# Patient Record
Sex: Male | Born: 1955 | Race: White | Hispanic: No | Marital: Married | Smoking: Former smoker
Health system: Southern US, Community
[De-identification: ages and names within clinical notes are randomized; demographics above are authoritative.]

## PROBLEM LIST (undated history)

## (undated) DIAGNOSIS — R768 Other specified abnormal immunological findings in serum: Secondary | ICD-10-CM

## (undated) DIAGNOSIS — E785 Hyperlipidemia, unspecified: Secondary | ICD-10-CM

## (undated) DIAGNOSIS — Z8551 Personal history of malignant neoplasm of bladder: Secondary | ICD-10-CM

## (undated) DIAGNOSIS — K603 Anal fistula: Secondary | ICD-10-CM

## (undated) DIAGNOSIS — N200 Calculus of kidney: Secondary | ICD-10-CM

## (undated) DIAGNOSIS — I10 Essential (primary) hypertension: Secondary | ICD-10-CM

## (undated) DIAGNOSIS — K219 Gastro-esophageal reflux disease without esophagitis: Secondary | ICD-10-CM

## (undated) HISTORY — DX: Gastro-esophageal reflux disease without esophagitis: K21.9

## (undated) HISTORY — DX: Essential (primary) hypertension: I10

## (undated) HISTORY — DX: Anal fistula: K60.3

## (undated) HISTORY — DX: Personal history of malignant neoplasm of bladder: Z85.51

## (undated) HISTORY — DX: Hyperlipidemia, unspecified: E78.5

## (undated) HISTORY — DX: Calculus of kidney: N20.0

## (undated) HISTORY — DX: Other specified abnormal immunological findings in serum: R76.8

---

## 2011-09-09 DIAGNOSIS — C679 Malignant neoplasm of bladder, unspecified: Secondary | ICD-10-CM | POA: Insufficient documentation

## 2011-10-06 DIAGNOSIS — F17210 Nicotine dependence, cigarettes, uncomplicated: Secondary | ICD-10-CM | POA: Insufficient documentation

## 2014-11-08 LAB — HM COLONOSCOPY

## 2014-11-14 DIAGNOSIS — D126 Benign neoplasm of colon, unspecified: Secondary | ICD-10-CM | POA: Insufficient documentation

## 2015-08-15 DIAGNOSIS — K219 Gastro-esophageal reflux disease without esophagitis: Secondary | ICD-10-CM

## 2015-08-15 HISTORY — DX: Gastro-esophageal reflux disease without esophagitis: K21.9

## 2015-08-15 LAB — HM HEPATITIS C SCREENING LAB: HM HEPATITIS C SCREENING: POSITIVE

## 2015-08-25 DIAGNOSIS — M7552 Bursitis of left shoulder: Secondary | ICD-10-CM | POA: Insufficient documentation

## 2015-10-15 DIAGNOSIS — K603 Anal fistula, unspecified: Secondary | ICD-10-CM

## 2015-10-15 HISTORY — DX: Anal fistula, unspecified: K60.30

## 2015-10-15 HISTORY — DX: Anal fistula: K60.3

## 2015-11-03 DIAGNOSIS — R7689 Other specified abnormal immunological findings in serum: Secondary | ICD-10-CM

## 2015-11-03 DIAGNOSIS — R768 Other specified abnormal immunological findings in serum: Secondary | ICD-10-CM

## 2015-11-03 HISTORY — DX: Other specified abnormal immunological findings in serum: R76.89

## 2015-11-03 HISTORY — DX: Other specified abnormal immunological findings in serum: R76.8

## 2015-11-06 DIAGNOSIS — Z8551 Personal history of malignant neoplasm of bladder: Secondary | ICD-10-CM

## 2015-11-06 HISTORY — DX: Personal history of malignant neoplasm of bladder: Z85.51

## 2016-01-23 DIAGNOSIS — L732 Hidradenitis suppurativa: Secondary | ICD-10-CM | POA: Insufficient documentation

## 2016-05-03 DIAGNOSIS — M5090 Cervical disc disorder, unspecified, unspecified cervical region: Secondary | ICD-10-CM | POA: Insufficient documentation

## 2016-06-30 ENCOUNTER — Encounter: Payer: Self-pay | Admitting: Family Medicine

## 2016-06-30 ENCOUNTER — Ambulatory Visit (INDEPENDENT_AMBULATORY_CARE_PROVIDER_SITE_OTHER): Payer: 59 | Admitting: Family Medicine

## 2016-06-30 ENCOUNTER — Ambulatory Visit (INDEPENDENT_AMBULATORY_CARE_PROVIDER_SITE_OTHER): Payer: 59

## 2016-06-30 VITALS — BP 155/93 | HR 85

## 2016-06-30 DIAGNOSIS — M25562 Pain in left knee: Secondary | ICD-10-CM

## 2016-06-30 DIAGNOSIS — M25462 Effusion, left knee: Secondary | ICD-10-CM

## 2016-06-30 DIAGNOSIS — N200 Calculus of kidney: Secondary | ICD-10-CM

## 2016-06-30 DIAGNOSIS — E7800 Pure hypercholesterolemia, unspecified: Secondary | ICD-10-CM | POA: Insufficient documentation

## 2016-06-30 DIAGNOSIS — M2341 Loose body in knee, right knee: Secondary | ICD-10-CM | POA: Diagnosis not present

## 2016-06-30 DIAGNOSIS — I10 Essential (primary) hypertension: Secondary | ICD-10-CM

## 2016-06-30 HISTORY — DX: Essential (primary) hypertension: I10

## 2016-06-30 HISTORY — DX: Calculus of kidney: N20.0

## 2016-06-30 MED ORDER — DICLOFENAC SODIUM 1 % TD GEL: 4.0000 g | Freq: Four times a day (QID) | TRANSDERMAL | 11 refills | Status: AC

## 2016-06-30 NOTE — Progress Notes (Signed)
Brett Daniels is a 61 y.o. male who presents to [place] [facility] today for left knee pain. Patient developed acute onset of left knee pain a few days ago. He denies any specific injury but notes that he's been doing a lot of kneeling recently. He felt a pop when he stood from a kneeling position. His knee has had swelling and pain since. He notes the pain is predominantly at the medial aspect of the knee. No weakness or numbness or loss of function.   Past Medical History:  Diagnosis Date  . 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 Essential hypertension 06/30/2016  . Gastroesophageal reflux disease 08/15/2015  . 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 History of bladder cancer 11/06/2015   Overview:  S/p chemotherapy in the bladder  . Renal stones 06/30/2016   No past surgical history on file. Social History  Substance Use Topics  . Smoking status: Not on file  . Smokeless tobacco: Not on file  . Alcohol use Not on file   family history is not on file.  ROS:  No headache, visual changes, nausea, vomiting, diarrhea, constipation, dizziness, abdominal pain, skin rash, fevers, chills, night sweats, weight loss, swollen lymph nodes, body aches, joint swelling, muscle aches, chest pain, shortness of breath, mood changes, visual or auditory hallucinations.    Medications: Current Outpatient Prescriptions  Medication Sig Dispense Refill  . aspirin 81 MG EC tablet Take by mouth.    Marland Kitchen atorvastatin (LIPITOR) 20 MG tablet Take by mouth.    . celecoxib (CELEBREX) 200 MG capsule Take by mouth.    Marland Kitchen lisinopril-hydrochlorothiazide (PRINZIDE,ZESTORETIC) 20-12.5 MG tablet TAKE ONE TABLET BY MOUTH ONCE DAILY    . methylPREDNISolone (MEDROL DOSEPAK) 4 MG TBPK tablet Take by mouth.    . traMADol (ULTRAM) 50 MG tablet 1-2 tid prn pain    . diclofenac  sodium (VOLTAREN) 1 % GEL Apply 4 g topically 4 (four) times daily. To affected joint. 100 g 11   No current facility-administered medications for this visit.    Not on File   Exam:  BP (!) 155/93   Pulse 85  General: Well Developed, well nourished, and in no acute distress.  Neuro/Psych: Alert and oriented x3, extra-ocular muscles intact, able to move all 4 extremities, sensation grossly intact. Skin: Warm and dry, no rashes noted.  Respiratory: Not using accessory muscles, speaking in full sentences, trachea midline.  Cardiovascular: Pulses palpable, no extremity edema. Abdomen: Does not appear distended. MSK: Left knee moderate effusion otherwise normal-appearing. Range of motion normal without patellar crepitations. Tender to palpation medial joint line. Stable ligamentous exam. Intact extension and flexion strength.  Procedure: Real-time Ultrasound Guided Aspiration and Injection of left knee   Device: GE Logiq E  Images permanently stored and available for review in the ultrasound unit. Verbal informed consent obtained. Discussed risks and benefits of procedure. Warned about infection bleeding damage to structures skin hypopigmentation and fat atrophy among others. Patient expresses understanding and agreement Time-out conducted.  Noted no overlying erythema, induration, or other signs of local infection.  Skin prepped in a sterile fashion.  Local anesthesia: Topical Ethyl chloride.  Lidocaine without epinephrine injected achieving good anesthesia. With sterile technique and under real time ultrasound guidance: 18 gauge needle inserted into the knee joint and 22ml of strawberry translucent fluid removed. The syringe was exchanged  and 80mg  kenalog and 27ml marcaine injected easily.  Completed without difficulty  Pain immediately resolved suggesting accurate placement of the medication.  Advised to call if fevers/chills, erythema, induration, drainage, or persistent  bleeding.  Images permanently stored and available for review in the ultrasound unit.  Impression: Technically successful ultrasound guided injection.      No results found for this or any previous visit (from the past 48 hour(s)). No results found.    Assessment and Plan: 61 y.o. male with left knee pain and effusion.  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 Fluid culture and cell count differential pending. X-ray pending.  Treatment with injection as above as well as diclofenac gel. Return as needed.    Orders Placed This Encounter  Procedures  . Body Fluid Culture    Left knee  . DG Knee 1-2 Views Right    Standing Status:   Future    Number of Occurrences:   1    Standing Expiration Date:   08/31/2017    Order Specific Question:   Reason for Exam (SYMPTOM  OR DIAGNOSIS REQUIRED)    Answer:   For use with the left knee x-ray bilateral AP and Rosenberg standing.    Order Specific Question:   Preferred imaging location?    Answer:   [facility]  . DG Knee Complete 4 Views Left    Please include patellar sunrise, lateral, and weightbearing bilateral AP and bilateral rosenberg views    Standing Status:   Future    Number of Occurrences:   1    Standing Expiration Date:   08/30/2017    Order Specific Question:   Reason for exam:    Answer:   Please include patellar sunrise, lateral, and weightbearing bilateral AP and bilateral rosenberg views    Comments:   Please include patellar sunrise, lateral, and weightbearing bilateral AP and bilateral rosenberg views    Order Specific Question:   Preferred imaging location?    Answer:   [facility]  . Synovial cell count + diff, w/ crystals    Left knee    Discussed warning signs or symptoms. Please see discharge instructions. Patient expresses understanding.

## 2016-06-30 NOTE — Patient Instructions (Signed)
Thank you for coming in today. Get xray today.  Apply voltaren gel for pain 4x daily as needed.  Return as needed if not better.    Meniscus Tear A meniscus tear is a knee injury in which a piece of the meniscus is torn. The meniscus is a thick, rubbery, wedge-shaped cartilage in the knee. Two menisci are located in each knee. They sit between the upper bone (femur) and lower bone (tibia) that make up the knee joint. Each meniscus acts as a shock absorber for the knee. A torn meniscus is one of the most common types of knee injuries. This injury can range from mild to severe. Surgery may be needed for a severe tear. What are the causes? This injury may be caused by any squatting, twisting, or pivoting movement. Sports-related injuries are the most common cause. These often occur from:  Running and stopping suddenly.  Changing direction.  Being tackled or knocked off your feet. As people get older, their meniscus gets thinner and weaker. In these people, tears can happen more easily, such as from climbing stairs. What increases the risk? This injury is more likely to happen to:  People who play contact sports.  Males.  People who are 59?59 years of age. What are the signs or symptoms? Symptoms of this injury include:  Knee pain, especially at the side of the knee joint. You may feel pain when the injury occurs, or you may only hear a pop and feel pain later.  A feeling that your knee is clicking, catching, locking, or giving way.  Not being able to fully bend or extend your knee.  Bruising or swelling in your knee. How is this diagnosed? This injury may be diagnosed based on your symptoms and a physical exam. The physical exam may include:  Moving your knee in different ways.  Feeling for tenderness.  Listening for a clicking sound.  Checking if your knee locks or catches. You may also have tests, such as:  X-rays.  MRI.  A procedure to look inside your knee with a  narrow surgical telescope (arthroscopy). You may be referred to a knee specialist (orthopedic surgeon). How is this treated? Treatment for this injury depends on the severity of the tear. Treatment for a mild tear may include:  Rest.  Medicine to reduce pain and swelling. This is usually a nonsteroidal anti-inflammatory drug (NSAID).  A knee brace or an elastic sleeve or wrap.  Using crutches or a walker to keep weight off your knee and to help you walk.  Exercises to strengthen your knee (physical therapy). You may need surgery if you have a severe tear or if other treatments are not working. Follow these instructions at home: Managing pain and swelling   Take over-the-counter and prescription medicines only as told by your health care provider.  If directed, apply ice to the injured area:  Put ice in a plastic bag.  Place a towel between your skin and the bag.  Leave the ice on for 20 minutes, 2-3 times per day.  Raise (elevate) the injured area above the level of your heart while you are sitting or lying down. Activity   Do not use the injured limb to support your body weight until your health care provider says that you can. Use crutches or a walker as told by your health care provider.  Return to your normal activities as told by your health care provider. Ask your health care provider what activities are safe for  you.  Perform range-of-motion exercises only as told by your health care provider.  Begin doing exercises to strengthen your knee and leg muscles only as told by your health care provider. After you recover, your health care provider may recommend these exercises to help prevent another injury. General instructions   Use a knee brace or elastic wrap as told by your health care provider.  Keep all follow-up visits as told by your health care provider. This is important. Contact a health care provider if:  You have a fever.  Your knee becomes red, tender,  or swollen.  Your pain medicine is not helping.  Your symptoms get worse or do not improve after 2 weeks of home care. This information is not intended to replace advice given to you by your health care provider. Make sure you discuss any questions you have with your health care provider. Document Released: 06/06/2002 Document Revised: 08/22/2015 Document Reviewed: 07/09/2014 Elsevier Interactive Patient Education  2017 [employer].

## 2016-07-01 LAB — SYNOVIAL CELL COUNT + DIFF, W/ CRYSTALS
Basophils, %: 0 %
EOSINOPHILS-SYNOVIAL: 0 % (ref 0–2)
Lymphocytes-Synovial Fld: 24 % (ref 0–74)
MONOCYTE/MACROPHAGE: 10 % (ref 0–69)
NEUTROPHIL, SYNOVIAL: 59 % — AB (ref 0–24)
Synoviocytes, %: 7 % (ref 0–15)
WBC, SYNOVIAL: 630 {cells}/uL — AB (ref <150)

## 2016-07-05 LAB — BODY FLUID CULTURE
GRAM STAIN: NONE SEEN
Gram Stain: NONE SEEN
Organism ID, Bacteria: NO GROWTH

## 2016-07-30 ENCOUNTER — Ambulatory Visit: Payer: 59 | Admitting: Family Medicine

## 2016-08-20 ENCOUNTER — Encounter (INDEPENDENT_AMBULATORY_CARE_PROVIDER_SITE_OTHER): Payer: Self-pay

## 2016-08-20 ENCOUNTER — Ambulatory Visit (INDEPENDENT_AMBULATORY_CARE_PROVIDER_SITE_OTHER): Payer: 59 | Admitting: Family Medicine

## 2016-08-20 ENCOUNTER — Encounter: Payer: Self-pay | Admitting: Family Medicine

## 2016-08-20 VITALS — BP 117/76 | HR 79 | Temp 97.9°F | Wt 180.0 lb

## 2016-08-20 DIAGNOSIS — I10 Essential (primary) hypertension: Secondary | ICD-10-CM

## 2016-08-20 DIAGNOSIS — Z8551 Personal history of malignant neoplasm of bladder: Secondary | ICD-10-CM

## 2016-08-20 DIAGNOSIS — K219 Gastro-esophageal reflux disease without esophagitis: Secondary | ICD-10-CM

## 2016-08-20 DIAGNOSIS — H60332 Swimmer's ear, left ear: Secondary | ICD-10-CM

## 2016-08-20 DIAGNOSIS — H6122 Impacted cerumen, left ear: Secondary | ICD-10-CM | POA: Diagnosis not present

## 2016-08-20 DIAGNOSIS — E7800 Pure hypercholesterolemia, unspecified: Secondary | ICD-10-CM

## 2016-08-20 MED ORDER — ZOSTER VAC RECOMB ADJUVANTED 50 MCG/0.5ML IM SUSR: 0.5000 mL | Freq: Once | INTRAMUSCULAR | 1 refills | Status: AC

## 2016-08-20 MED ORDER — ATORVASTATIN CALCIUM 20 MG PO TABS: 20.0000 mg | ORAL_TABLET | Freq: Every day | ORAL | 3 refills | Status: DC

## 2016-08-20 MED ORDER — LISINOPRIL-HYDROCHLOROTHIAZIDE 20-12.5 MG PO TABS: 1.0000 | ORAL_TABLET | Freq: Every day | ORAL | 3 refills | Status: DC

## 2016-08-20 MED ORDER — OMEPRAZOLE 20 MG PO CPDR: 20.0000 mg | DELAYED_RELEASE_CAPSULE | Freq: Every day | ORAL | 3 refills | Status: AC

## 2016-08-20 MED ORDER — NEOMYCIN-POLYMYXIN-HC 3.5-10000-1 OT SOLN: 4.0000 [drp] | Freq: Four times a day (QID) | OTIC | 1 refills | Status: DC

## 2016-08-20 NOTE — Progress Notes (Signed)
Brett Daniels is a 61 y.o. male who presents to [facility] [place] [place] today for establish medical care and discuss left-sided ear pain. Patient notes a several week history of left ear pain. He was seen in an urgent care and prescribed oral amoxicillin and some unknown eardrops. This has helped a little. He denies any significant change of hearing but does note some crackling sounds in his ear. He notes the ear is very tender to touch and with chewing. He denies any fevers or chills.  His past medical history is significant for hypertension hyperlipidemia and a history of bladder cancer. Additionally he has a history of past infection with hepatitis C that is not currently active. He feels well. He recently quit smoking earlier this month and is very optimistic about his health.    Past Medical History:  Diagnosis Date  . 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 Essential hypertension 06/30/2016  . Gastroesophageal reflux disease 08/15/2015  . Hepatitis C antibody positive in blood 11/03/2015   Overview:  Confirmed negative by Dr Shary Key at [facility]  . History of bladder cancer 11/06/2015   Overview:  S/p chemotherapy in the bladder  . Hyperlipidemia   . Renal stones 06/30/2016   History reviewed. No pertinent surgical history. Social History  Substance Use Topics  . Smoking status: Former Smoker    Packs/day: 1.00    Years: 40.00    Types: Cigarettes    Quit date: 08/03/2016  . Smokeless tobacco: Never Used  . Alcohol use Yes   family history includes Cancer in his brother; Diabetes in his mother and sister; Heart disease in his brother; Hyperlipidemia in his brother and sister; Hypertension in his brother and sister.  ROS as above:  Medications: Current Outpatient Prescriptions  Medication Sig Dispense Refill  . AMOXICILLIN PO Take by  mouth.    Marland Kitchen aspirin 81 MG EC tablet Take by mouth.    Marland Kitchen atorvastatin (LIPITOR) 20 MG tablet Take 1 tablet (20 mg total) by mouth daily. 90 tablet 3  . diclofenac sodium (VOLTAREN) 1 % GEL Apply 4 g topically 4 (four) times daily. To affected joint. 100 g 11  . lisinopril-hydrochlorothiazide (PRINZIDE,ZESTORETIC) 20-12.5 MG tablet Take 1 tablet by mouth daily. 90 tablet 3  . co-enzyme Q-10 30 MG capsule Take by mouth.    . neomycin-polymyxin-hydrocortisone (CORTISPORIN) otic solution Place 4 drops into the left ear 4 (four) times daily. 10 mL 1  . omeprazole (PRILOSEC) 20 MG capsule Take 1 capsule (20 mg total) by mouth daily. 90 capsule 3  . TURMERIC PO Take by mouth.    . Vitamin D, Cholecalciferol, 1000 units CAPS Take 1,000 units of lipase by mouth.    . Zoster Vac Recomb Adjuvanted [facility]) injection Inject 0.5 mLs into the muscle once. Give again after 2 months. If administered in clinic please fax report to Dr Georgina Snell [phone] 1 each 1   No current facility-administered medications for this visit.    Not on File  Health Maintenance Health Maintenance  Topic Date Due  . Hepatitis C Screening  21-May-1955  . HIV Screening  11/20/1972  . COLONOSCOPY  11/21/2007  . INFLUENZA VACCINE  10/28/2016  . TETANUS/TDAP  10/21/2024     Exam:  BP 117/76   Pulse 79   Temp 97.9 F (36.6 C) (Oral)   Wt 180 lb (81.6 kg)  Gen: Well NAD HEENT:  EOMI,  MMM right TM milky no bulging. Nontender. Left ear is normal-appearing externally. Tender with motion. Ear canal obstructed by cerumen.  Lungs: Normal work of breathing. CTABL Heart: RRR no MRG Abd: NABS, Soft. Nondistended, Nontender Exts: Brisk capillary refill, warm and well perfused.   Irrigated from the left ear. Following cerumen removal the ear canal was erythematous and narrow.    No results found for this or any previous visit (from the past 72 hour(s)). No results found.    Assessment and Plan: 61 y.o. male with    Left-sided cerumen impaction with otitis externa. Treat empirically with Cortisporin eardrops. Recommend also rubbing alcohol intermittently. Plan for watchful waiting.  Hypertension doing well continue current regimen.  Hyperlipidemia doing well continue current regimen will check fasting lipids at a wellness exam in August or early September.  History of bladder cancer doing well on annual surveillance with urology.  Smoking: Patient quit smoking a few weeks ago. He is doing great continue to follow.   No orders of the defined types were placed in this encounter.  Meds ordered this encounter  Medications  . AMOXICILLIN PO    Sig: Take by mouth.  . Vitamin D, Cholecalciferol, 1000 units CAPS    Sig: Take 1,000 units of lipase by mouth.  . co-enzyme Q-10 30 MG capsule    Sig: Take by mouth.  . DISCONTD: omeprazole (PRILOSEC) 20 MG capsule    Sig: Take by mouth.  . TURMERIC PO    Sig: Take by mouth.  Marland Kitchen atorvastatin (LIPITOR) 20 MG tablet    Sig: Take 1 tablet (20 mg total) by mouth daily.    Dispense:  90 tablet    Refill:  3  . lisinopril-hydrochlorothiazide (PRINZIDE,ZESTORETIC) 20-12.5 MG tablet    Sig: Take 1 tablet by mouth daily.    Dispense:  90 tablet    Refill:  3  . omeprazole (PRILOSEC) 20 MG capsule    Sig: Take 1 capsule (20 mg total) by mouth daily.    Dispense:  90 capsule    Refill:  3  . neomycin-polymyxin-hydrocortisone (CORTISPORIN) otic solution    Sig: Place 4 drops into the left ear 4 (four) times daily.    Dispense:  10 mL    Refill:  1  . Zoster Vac Recomb Adjuvanted [facility]) injection    Sig: Inject 0.5 mLs into the muscle once. Give again after 2 months. If administered in clinic please fax report to Dr Georgina Snell [phone]    Dispense:  1 each    Refill:  1     Discussed warning signs or symptoms. Please see discharge instructions. Patient expresses understanding.

## 2016-08-20 NOTE — Patient Instructions (Signed)
Thank you for coming in today. Use the cortisporin drops 4x daily.  Use also over the counter rubbing alcohol in between drops.  Recheck as needed.    Otitis Externa Otitis externa is an infection of the outer ear canal. The outer ear canal is the area between the outside of the ear and the eardrum. Otitis externa is sometimes called "swimmer's ear." What are the causes? This condition may be caused by:  Swimming in dirty water.  Moisture in the ear.  An injury to the inside of the ear.  An object stuck in the ear.  A cut or scrape on the outside of the ear. What increases the risk? This condition is more likely to develop in swimmers. What are the signs or symptoms? The first symptom of this condition is often itching in the ear. Later signs and symptoms include:  Swelling of the ear.  Redness in the ear.  Ear pain. The pain may get worse when you pull on your ear.  Pus coming from the ear. How is this diagnosed? This condition may be diagnosed by examining the ear and testing fluid from the ear for bacteria and funguses. How is this treated? This condition may be treated with:  Antibiotic ear drops. These are often given for 10-14 days.  Medicine to reduce itching and swelling. Follow these instructions at home:  If you were prescribed antibiotic ear drops, apply them as told by your health care provider. Do not stop using the antibiotic even if your condition improves.  Take over-the-counter and prescription medicines only as told by your health care provider.  Keep all follow-up visits as told by your health care provider. This is important. How is this prevented?  Keep your ear dry. Use the corner of a towel to dry your ear after you swim or bathe.  Avoid scratching or putting things in your ear. Doing these things can damage the ear canal or remove the protective wax that lines it, which makes it easier for bacteria and funguses to grow.  Avoid swimming in  lakes, polluted water, or pools that may not have the right amount of chlorine.  Consider making ear drops and putting 3 or 4 drops in each ear after you swim. Ask your health care provider about how you can make ear drops. Contact a health care provider if:  You have a fever.  After 3 days your ear is still red, swollen, painful, or draining pus.  Your redness, swelling, or pain gets worse.  You have a severe headache.  You have redness, swelling, pain, or tenderness in the area behind your ear. This information is not intended to replace advice given to you by your health care provider. Make sure you discuss any questions you have with your health care provider. Document Released: 03/16/2005 Document Revised: 04/23/2015 Document Reviewed: 12/24/2014 Elsevier Interactive Patient Education  2017 [employer].

## 2016-10-30 ENCOUNTER — Telehealth: Payer: Self-pay | Admitting: Family Medicine

## 2016-10-30 NOTE — Telephone Encounter (Signed)
If he's having a recurrence of swimmer's ear as well as cerumen impaction which he was seen for previously, this is easily treated in the primary care setting, if it something else, we would probably need to see it to determine if ENT is the appropriate referral.

## 2016-10-30 NOTE — Telephone Encounter (Signed)
Pt would like referral to ENT for his ears. Thanks

## 2016-10-30 NOTE — Telephone Encounter (Signed)
Patient did see Dr Georgina Snell in May for ear problems.

## 2016-11-06 ENCOUNTER — Telehealth: Payer: Self-pay | Admitting: Family Medicine

## 2016-11-06 MED ORDER — NEOMYCIN-POLYMYXIN-HC 3.5-10000-1 OT SOLN: 4.0000 [drp] | Freq: Four times a day (QID) | OTIC | 1 refills | Status: DC

## 2016-11-06 NOTE — Telephone Encounter (Signed)
Recurrent ear pain suspect otitis externa.  Tx Cortisporin.  Follow up with me soon.

## 2016-11-09 NOTE — Telephone Encounter (Signed)
Patient will follow up for recurrent ear infections.

## 2016-11-09 NOTE — Telephone Encounter (Signed)
Left message for a return call. See other telephone note.

## 2016-11-09 NOTE — Telephone Encounter (Signed)
Left message for a return call

## 2016-11-16 ENCOUNTER — Encounter: Payer: Self-pay | Admitting: Family Medicine

## 2016-11-16 ENCOUNTER — Ambulatory Visit (INDEPENDENT_AMBULATORY_CARE_PROVIDER_SITE_OTHER): Payer: 59 | Admitting: Family Medicine

## 2016-11-16 VITALS — BP 135/87 | HR 72 | Temp 98.0°F | Wt 194.0 lb

## 2016-11-16 DIAGNOSIS — H60392 Other infective otitis externa, left ear: Secondary | ICD-10-CM | POA: Diagnosis not present

## 2016-11-16 DIAGNOSIS — S161XXA Strain of muscle, fascia and tendon at neck level, initial encounter: Secondary | ICD-10-CM

## 2016-11-16 MED ORDER — CYCLOBENZAPRINE HCL 10 MG PO TABS: 10.0000 mg | ORAL_TABLET | Freq: Every day | ORAL | 1 refills | Status: AC

## 2016-11-16 MED ORDER — CLOTRIMAZOLE 1 % EX SOLN: CUTANEOUS | 1 refills | Status: DC

## 2016-11-16 NOTE — Patient Instructions (Signed)
Thank you for coming in today. Apply the clotrimazole solution to the ear twice daily for 14 days.  You should hear form ENT.   Take flexeril at bedtime as needed for pain.   Do home exercises.   Recheck as needed.   Let me know if you want to DO PT.    TENS UNIT: This is helpful for muscle pain and spasm.   Search and Purchase a TENS 7000 2nd edition at  www.tenspros.com or www.[employer].com It should be less than $30.     TENS unit instructions: Do not shower or bathe with the unit on Turn the unit off before removing electrodes or batteries If the electrodes lose stickiness add a drop of water to the electrodes after they are disconnected from the unit and place on plastic sheet. If you continued to have difficulty, call the TENS unit company to purchase more electrodes. Do not apply lotion on the skin area prior to use. Make sure the skin is clean and dry as this will help prolong the life of the electrodes. After use, always check skin for unusual red areas, rash or other skin difficulties. If there are any skin problems, does not apply electrodes to the same area. Never remove the electrodes from the unit by pulling the wires. Do not use the TENS unit or electrodes other than as directed. Do not change electrode placement without consultating your therapist or physician. Keep 2 fingers with between each electrode. Wear time ratio is 2:1, on to off times.    For example on for 30 minutes off for 15 minutes and then on for 30 minutes off for 15 minutes

## 2016-11-16 NOTE — Progress Notes (Signed)
Brett Daniels is a 61 y.o. male who presents to [facility]: [facility] today for left ear pain. Patient has a history of recurrent otitis externa. This is often complicated by cerumen impaction. He notes the last month he's had bilateral left worse than right ear pain. He's used prescription Cortisporin drops which have helped only a little. Additionally he's tried over-the-counter rubbing alcohol drops which have not helped much. He denies any fevers or chills.  Additionally patient notes neck pain. He notes a month long history of pain in the posterior aspect of the neck that is interfering with sleep. He denies any radiating pain weakness or numbness. He denies any injury. He's tried some over-the-counter medications which have helped some.   Past Medical History:  Diagnosis Date  . 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 Essential hypertension 06/30/2016  . Gastroesophageal reflux disease 08/15/2015  . Hepatitis C antibody positive in blood 11/03/2015   Overview:  Confirmed negative by Dr Shary Key at [facility]  . History of bladder cancer 11/06/2015   Overview:  S/p chemotherapy in the bladder  . Hyperlipidemia   . Renal stones 06/30/2016   No past surgical history on file. Social History  Substance Use Topics  . Smoking status: Former Smoker    Packs/day: 1.00    Years: 40.00    Types: Cigarettes    Quit date: 08/03/2016  . Smokeless tobacco: Never Used  . Alcohol use Yes   family history includes Cancer in his brother; Diabetes in his mother and sister; Heart disease in his brother; Hyperlipidemia in his brother and sister; Hypertension in his brother and sister.  ROS as above:  Medications: Current Outpatient Prescriptions  Medication Sig Dispense Refill  . AMOXICILLIN PO Take by mouth.    Marland Kitchen aspirin 81 MG EC tablet Take by mouth.     Marland Kitchen atorvastatin (LIPITOR) 20 MG tablet Take 1 tablet (20 mg total) by mouth daily. 90 tablet 3  . co-enzyme Q-10 30 MG capsule Take by mouth.    . diclofenac sodium (VOLTAREN) 1 % GEL Apply 4 g topically 4 (four) times daily. To affected joint. 100 g 11  . lisinopril-hydrochlorothiazide (PRINZIDE,ZESTORETIC) 20-12.5 MG tablet Take 1 tablet by mouth daily. 90 tablet 3  . neomycin-polymyxin-hydrocortisone (CORTISPORIN) OTIC solution Place 4 drops into the left ear 4 (four) times daily. 10 mL 1  . omeprazole (PRILOSEC) 20 MG capsule Take 1 capsule (20 mg total) by mouth daily. 90 capsule 3  . TURMERIC PO Take by mouth.    . Vitamin D, Cholecalciferol, 1000 units CAPS Take 1,000 units of lipase by mouth.    . clotrimazole (LOTRIMIN) 1 % external solution Apply 4 drops to left ear BID x14 days. OK to sub to OTIC form 30 mL 1  . cyclobenzaprine (FLEXERIL) 10 MG tablet Take 1 tablet (10 mg total) by mouth at bedtime. 90 tablet 1   No current facility-administered medications for this visit.    Not on File  Health Maintenance Health Maintenance  Topic Date Due  . Hepatitis C Screening  1955/12/10  . HIV Screening  11/20/1970  . COLONOSCOPY  11/21/2007  . INFLUENZA VACCINE  10/28/2016  . TETANUS/TDAP  10/21/2024     Exam:  BP 135/87   Pulse 72   Temp 98 F (36.7 C) (Oral)   Wt 194 lb (88 kg)   SpO2 97%  Gen: Well  NAD  HEENT: EOMI,  MMM Left ear canal is narrowed inflamed with mild erythema with whitish material. Small amount of discharge is present.  The right ear is similar but less affected. Mastoids are nontender bilaterally. Lungs: Normal work of breathing. CTABL Heart: RRR no MRG Abd: NABS, Soft. Nondistended, Nontender Exts: Brisk capillary refill, warm and well perfused.  MSK: C-spine is nontender to midline. Tender palpation bilateral cervical paraspinal muscle group. Normal neck motion.  No results found for this or any previous visit (from the past 72 hour(s)). No results  found.    Assessment and Plan: 61 y.o. male with  Otitis externa unclear etiology. Bacterial and follow culture pending of left ear. Additionally refer to ENT. Will use clotrimazole solution for presumed fungal otitis externa.  Neck pain: Likely myofascial disruption. Plan for Flexeril at bedtime as well as self exercise type physical therapy. If not better will refer for formal physical therapy.   Orders Placed This Encounter  Procedures  . Fungal Stain    ear  . Ear Culture    Left ear  . Ambulatory referral to ENT    Referral Priority:   Routine    Referral Type:   Consultation    Referral Reason:   Specialty Services Required    Requested Specialty:   Otolaryngology    Number of Visits Requested:   1   Meds ordered this encounter  Medications  . cyclobenzaprine (FLEXERIL) 10 MG tablet    Sig: Take 1 tablet (10 mg total) by mouth at bedtime.    Dispense:  90 tablet    Refill:  1  . clotrimazole (LOTRIMIN) 1 % external solution    Sig: Apply 4 drops to left ear BID x14 days. OK to sub to OTIC form    Dispense:  30 mL    Refill:  1     Discussed warning signs or symptoms. Please see discharge instructions. Patient expresses understanding.

## 2016-11-18 LAB — FUNGAL STAIN

## 2016-11-19 ENCOUNTER — Telehealth: Payer: Self-pay

## 2016-11-19 MED ORDER — MICONAZOLE NITRATE 2 % EX SOLN: CUTANEOUS | 12 refills | Status: AC

## 2016-11-19 NOTE — Telephone Encounter (Signed)
Brett Daniels from [place] called and stated that the Lotrimin 1% solution is not covered by his insurance, and is expensive.  Is there another option?

## 2016-11-19 NOTE — Telephone Encounter (Signed)
Clotrimazole is unavailable. We'll send in miconazole prescription to compounding pharmacy. Patient notified

## 2016-11-20 LAB — EAR CULTURE

## 2017-02-25 ENCOUNTER — Encounter: Payer: 59 | Admitting: Family Medicine

## 2017-03-04 ENCOUNTER — Ambulatory Visit (INDEPENDENT_AMBULATORY_CARE_PROVIDER_SITE_OTHER): Payer: 59 | Admitting: Family Medicine

## 2017-03-04 ENCOUNTER — Encounter: Payer: Self-pay | Admitting: Family Medicine

## 2017-03-04 VITALS — BP 143/97 | HR 65 | Ht 71.0 in | Wt 199.0 lb

## 2017-03-04 DIAGNOSIS — E7800 Pure hypercholesterolemia, unspecified: Secondary | ICD-10-CM

## 2017-03-04 DIAGNOSIS — Z0000 Encounter for general adult medical examination without abnormal findings: Secondary | ICD-10-CM | POA: Diagnosis not present

## 2017-03-04 DIAGNOSIS — K219 Gastro-esophageal reflux disease without esophagitis: Secondary | ICD-10-CM

## 2017-03-04 DIAGNOSIS — K603 Anal fistula: Secondary | ICD-10-CM | POA: Diagnosis not present

## 2017-03-04 DIAGNOSIS — Z114 Encounter for screening for human immunodeficiency virus [HIV]: Secondary | ICD-10-CM

## 2017-03-04 MED ORDER — CIPROFLOXACIN HCL 500 MG PO TABS: 500.0000 mg | ORAL_TABLET | Freq: Two times a day (BID) | ORAL | 2 refills | Status: AC

## 2017-03-04 NOTE — Patient Instructions (Addendum)
Thank you for coming in today.  Get labs in the near future. Labs should be fasting.   Continue current medicine.  When we get labs back I will let you know.  I will send you a message through mychart.   For weight try to keep an eye on calories using an app like myfitness pal or lose it. I think apple has a fitness app as well.   Exercise is great for health.   For the fistual/abscess. If it reves up and needs to be drained make a same day appointment with me or Dr T.   Take cipro if you feel it coming on.

## 2017-03-04 NOTE — Progress Notes (Signed)
Rivaldo Hineman is a 61 y.o. male who presents to [facility] [place]: [facility] today for well adult visit.  Liliane Channel is doing well overall.  He takes the medications listed below.  He has successfully quit smoking but notes that he is gained a bit of weight in the interim.  He notes that he has trouble finding time to exercise and has trouble eating a healthy diet.  He is a Tree surgeon at [employer].  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  Unfortunately he was all lined up for at the time of surgery he had no obvious fistula to cannulize and surgically excise.  He notes that recently he started having pain and discharge.  He was able to get a good drainage recently and is currently asymptomatic.   Past Medical History:  Diagnosis Date  . 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 Essential hypertension 06/30/2016  . Gastroesophageal reflux disease 08/15/2015  . Hepatitis C antibody positive in blood 11/03/2015   Overview:  Confirmed negative by Dr Shary Key at [facility]  . History of bladder cancer 11/06/2015   Overview:  S/p chemotherapy in the bladder  . Hyperlipidemia   . Renal stones 06/30/2016   No past surgical history on file. Social History   Tobacco Use  . Smoking status: Former Smoker    Packs/day: 1.00    Years: 40.00    Pack years: 40.00    Types: Cigarettes    Last attempt to quit: 08/03/2016    Years since quitting: 0.5  . Smokeless tobacco: Never Used  Substance Use Topics  . Alcohol use: Yes   family history includes Cancer in his brother; Diabetes in his mother and sister; Heart disease in his brother; Hyperlipidemia in his brother and sister; Hypertension in his brother and sister.  ROS as above:  Medications: Current Outpatient Medications  Medication Sig  Dispense Refill  . aspirin 81 MG EC tablet Take by mouth.    Marland Kitchen atorvastatin (LIPITOR) 20 MG tablet Take 1 tablet (20 mg total) by mouth daily. 90 tablet 3  . co-enzyme Q-10 30 MG capsule Take by mouth.    . cyclobenzaprine (FLEXERIL) 10 MG tablet Take 1 tablet (10 mg total) by mouth at bedtime. 90 tablet 1  . diclofenac sodium (VOLTAREN) 1 % GEL Apply 4 g topically 4 (four) times daily. To affected joint. 100 g 11  . lisinopril-hydrochlorothiazide (PRINZIDE,ZESTORETIC) 20-12.5 MG tablet Take 1 tablet by mouth daily. 90 tablet 3  . Miconazole Nitrate 2 % SOLN 4 drops to ear twice daily for 2 weeks 29.57 mL 12  . omeprazole (PRILOSEC) 20 MG capsule Take 1 capsule (20 mg total) by mouth daily. 90 capsule 3  . TURMERIC PO Take by mouth.    . Vitamin D, Cholecalciferol, 1000 units CAPS Take 1,000 units of lipase by mouth.    . ciprofloxacin (CIPRO) 500 MG tablet Take 1 tablet (500 mg total) by mouth 2 (two) times daily. 14 tablet 2   No current facility-administered medications for this visit.    No Known Allergies  Health Maintenance Health Maintenance  Topic Date Due  . HIV Screening  [birth year]1972  . INFLUENZA VACCINE  10/28/2016  . TETANUS/TDAP  10/21/2024  . COLONOSCOPY  11/07/2024  . Hepatitis C Screening  Completed     Exam:  BP (!) 143/97   Pulse 65   Ht 5\' 11"  (1.803 m)   Wt 199 lb (90.3 kg)   BMI 27.75 kg/m  Gen: Well NAD HEENT: EOMI,  MMM Lungs: Normal work of breathing. CTABL Heart: RRR no MRG Abd: NABS, Soft. Nondistended, Nontender Exts: Brisk capillary refill, warm and well perfused.  Anus: Scarring surrounding the 5 o'clock position of the anus with no erythema induration or fluctuance.  No fistula tract or discharge visible.   No results found for this or any previous visit (from the past 72 hour(s)). No results found.    Assessment and Plan: 61 y.o. male with well adult visit.  Doing pretty well overall.  We strategize some healthy eating options as well as  food logging.  Additionally we discussed about adding exercise.  Plan to also check basic fasting labs listed below.  Lastly we discussed action plan for perirectal abscesses and fistula.  Firstly I prescribed Cipro that he can take if he starts having symptoms again.  This may catch pain before it becomes an abscess.  Additionally he noted that he can return to clinic on a urgent as-needed basis for abscess drainage if he has one that recurs.  Recheck in 6 months.    Orders Placed This Encounter  Procedures  . HM HEPATITIS C SCREENING LAB    This external order was created through the Results Console.  . CBC  . COMPLETE METABOLIC PANEL WITH GFR  . Lipid Panel w/reflex Direct LDL  . HIV antibody  . HM COLONOSCOPY    This external order was created through the Results Console.   Meds ordered this encounter  Medications  . ciprofloxacin (CIPRO) 500 MG tablet    Sig: Take 1 tablet (500 mg total) by mouth 2 (two) times daily.    Dispense:  14 tablet    Refill:  2     Discussed warning signs or symptoms. Please see discharge instructions. Patient expresses understanding.

## 2017-03-11 ENCOUNTER — Encounter: Payer: Self-pay | Admitting: Family Medicine

## 2017-03-11 ENCOUNTER — Ambulatory Visit (INDEPENDENT_AMBULATORY_CARE_PROVIDER_SITE_OTHER): Payer: 59 | Admitting: Family Medicine

## 2017-03-11 VITALS — BP 188/118 | HR 66 | Wt 201.0 lb

## 2017-03-11 DIAGNOSIS — I160 Hypertensive urgency: Secondary | ICD-10-CM | POA: Diagnosis not present

## 2017-03-11 DIAGNOSIS — R0602 Shortness of breath: Secondary | ICD-10-CM

## 2017-03-11 DIAGNOSIS — I10 Essential (primary) hypertension: Secondary | ICD-10-CM

## 2017-03-11 LAB — COMPLETE METABOLIC PANEL WITH GFR
AG RATIO: 1.5 (calc) (ref 1.0–2.5)
ALKALINE PHOSPHATASE (APISO): 64 U/L (ref 40–115)
ALT: 45 U/L (ref 9–46)
AST: 33 U/L (ref 10–35)
Albumin: 4.3 g/dL (ref 3.6–5.1)
BUN: 11 mg/dL (ref 7–25)
CHLORIDE: 103 mmol/L (ref 98–110)
CO2: 28 mmol/L (ref 20–32)
Calcium: 9.4 mg/dL (ref 8.6–10.3)
Creat: 1.02 mg/dL (ref 0.70–1.33)
GFR, EST AFRICAN AMERICAN: 93 mL/min/{1.73_m2} (ref >=60)
GFR, Est Non African American: 80 mL/min/{1.73_m2} (ref >=60)
GLOBULIN: 2.9 g/dL (ref 1.9–3.7)
Glucose, Bld: 92 mg/dL (ref 65–99)
POTASSIUM: 3.6 mmol/L (ref 3.5–5.3)
Sodium: 142 mmol/L (ref 135–146)
Total Bilirubin: 0.7 mg/dL (ref 0.2–1.2)
Total Protein: 7.2 g/dL (ref 6.1–8.1)

## 2017-03-11 LAB — CBC
HCT: 41.8 % (ref 38.5–50.0)
Hemoglobin: 14.9 g/dL (ref 13.2–17.1)
MCH: 32 pg (ref 27.0–33.0)
MCHC: 35.6 g/dL (ref 32.0–36.0)
MCV: 89.7 fL (ref 80.0–100.0)
MPV: 10.9 fL (ref 7.5–12.5)
PLATELETS: 261 10*3/uL (ref 140–400)
RBC: 4.66 10*6/uL (ref 4.20–5.80)
RDW: 11.9 % (ref 11.0–15.0)
WBC: 8.1 10*3/uL (ref 3.8–10.8)

## 2017-03-11 LAB — LIPID PANEL W/REFLEX DIRECT LDL
Cholesterol: 238 mg/dL — ABNORMAL HIGH (ref <200)
HDL: 44 mg/dL (ref >40)
LDL CHOLESTEROL (CALC): 155 mg/dL — AB
Non-HDL Cholesterol (Calc): 194 mg/dL (calc) — ABNORMAL HIGH (ref <130)
Total CHOL/HDL Ratio: 5.4 (calc) — ABNORMAL HIGH (ref <5.0)
Triglycerides: 218 mg/dL — ABNORMAL HIGH (ref <150)

## 2017-03-11 LAB — HIV ANTIBODY (ROUTINE TESTING W REFLEX): HIV: NONREACTIVE

## 2017-03-11 MED ORDER — CARVEDILOL 6.25 MG PO TABS: 6.2500 mg | ORAL_TABLET | Freq: Two times a day (BID) | ORAL | 3 refills | Status: AC

## 2017-03-11 MED ORDER — LISINOPRIL-HYDROCHLOROTHIAZIDE 20-12.5 MG PO TABS: 2.0000 | ORAL_TABLET | Freq: Every day | ORAL | 3 refills | Status: AC

## 2017-03-11 NOTE — Patient Instructions (Signed)
Thank you for coming in today. Increase the lisinopril/HCTZ medicine to 2 pills daily.  Add Coreg twice daily.  Recheck in about 10-20 days.  Return sooner if needed.  Continue home blood pressure log.  Call or go to the emergency room if you get worse, have trouble breathing, have chest pains, or palpitations.   Carvedilol tablets What is this medicine? CARVEDILOL (KAR ve dil ol) is a beta-blocker. Beta-blockers reduce the workload on the heart and help it to beat more regularly. This medicine is used to treat high blood pressure and heart failure. This medicine may be used for other purposes; ask your health care provider or pharmacist if you have questions. COMMON BRAND NAME(S): Coreg What should I tell my health care provider before I take this medicine? They need to know if you have any of these conditions: -circulation problems -diabetes -history of heart attack or heart disease -liver disease -lung or breathing disease, like asthma or emphysema -pheochromocytoma -slow or irregular heartbeat -thyroid disease -an unusual or allergic reaction to carvedilol, other beta-blockers, medicines, foods, dyes, or preservatives -pregnant or trying to get pregnant -breast-feeding How should I use this medicine? Take this medicine by mouth with a glass of water. Follow the directions on the prescription label. It is best to take the tablets with food. Take your doses at regular intervals. Do not take your medicine more often than directed. Do not stop taking except on the advice of your doctor or health care professional. Talk to your pediatrician regarding the use of this medicine in children. Special care may be needed. Overdosage: If you think you have taken too much of this medicine contact a poison control center or emergency room at once. NOTE: This medicine is only for you. Do not share this medicine with others. What if I miss a dose? If you miss a dose, take it as soon as you can. If  it is almost time for your next dose, take only that dose. Do not take double or extra doses. What may interact with this medicine? This medicine may interact with the following medications: -certain medicines for blood pressure, heart disease, irregular heart beat -certain medicines for depression, like fluoxetine or paroxetine -certain medicines for diabetes, like glipizide or glyburide -cimetidine -clonidine -cyclosporine -digoxin -MAOIs like Carbex, Eldepryl, Marplan, Nardil, and Parnate -reserpine -rifampin This list may not describe all possible interactions. Give your health care provider a list of all the medicines, herbs, non-prescription drugs, or dietary supplements you use. Also tell them if you smoke, drink alcohol, or use illegal drugs. Some items may interact with your medicine. What should I watch for while using this medicine? Check your heart rate and blood pressure regularly while you are taking this medicine. Ask your doctor or health care professional what your heart rate and blood pressure should be, and when you should contact him or her. Do not stop taking this medicine suddenly. This could lead to serious heart-related effects. Contact your doctor or health care professional if you have difficulty breathing while taking this drug. Check your weight daily. Ask your doctor or health care professional when you should notify him/her of any weight gain. You may get drowsy or dizzy. Do not drive, use machinery, or do anything that requires mental alertness until you know how this medicine affects you. To reduce the risk of dizzy or fainting spells, do not sit or stand up quickly. Alcohol can make you more drowsy, and increase flushing and rapid heartbeats. Avoid alcoholic  drinks. If you have diabetes, check your blood sugar as directed. Tell your doctor if you have changes in your blood sugar while you are taking this medicine. If you are going to have surgery, tell your doctor  or health care professional that you are taking this medicine. What side effects may I notice from receiving this medicine? Side effects that you should report to your doctor or health care professional as soon as possible: -allergic reactions like skin rash, itching or hives, swelling of the face, lips, or tongue -breathing problems -dark urine -irregular heartbeat -swollen legs or ankles -vomiting -yellowing of the eyes or skin Side effects that usually do not require medical attention (report to your doctor or health care professional if they continue or are bothersome): -change in sex drive or performance -diarrhea -dry eyes (especially if wearing contact lenses) -dry, itching skin -headache -nausea -unusually tired This list may not describe all possible side effects. Call your doctor for medical advice about side effects. You may report side effects to FDA at 1-800-FDA-1088. Where should I keep my medicine? Keep out of the reach of children. Store at room temperature below 30 degrees C (86 degrees F). Protect from moisture. Keep container tightly closed. Throw away any unused medicine after the expiration date. NOTE: This sheet is a summary. It may not cover all possible information. If you have questions about this medicine, talk to your doctor, pharmacist, or health care provider.  2018 Elsevier/Gold Standard (2012-11-20 14:12:02)

## 2017-03-11 NOTE — Progress Notes (Signed)
Brett Daniels is a 61 y.o. male who presents to [place] today for hypertension.  Brett Daniels has been measuring his blood pressure several times since his last visit a few days ago.  He notes his blood pressures have been elevated.  He measured his blood pressure today in the 180s over 100s.  He denies any chest pain but does note some shortness of breath with exertion last night while shoveling snow.  He feels well otherwise but is anxious about his blood pressure.  No headaches or loss of function.   Past Medical History:  Diagnosis Date  . 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 Essential hypertension 06/30/2016  . Gastroesophageal reflux disease 08/15/2015  . Hepatitis C antibody positive in blood 11/03/2015   Overview:  Confirmed negative by Dr Shary Key at [facility]  . History of bladder cancer 11/06/2015   Overview:  S/p chemotherapy in the bladder  . Hyperlipidemia   . Renal stones 06/30/2016   No past surgical history on file. Social History   Tobacco Use  . Smoking status: Former Smoker    Packs/day: 1.00    Years: 40.00    Pack years: 40.00    Types: Cigarettes    Last attempt to quit: 08/03/2016    Years since quitting: 0.6  . Smokeless tobacco: Never Used  Substance Use Topics  . Alcohol use: Yes   family history includes Cancer in his brother; Diabetes in his mother and sister; Heart disease in his brother; Hyperlipidemia in his brother and sister; Hypertension in his brother and sister.  ROS as above:  Medications: Current Outpatient Medications  Medication Sig Dispense Refill  . aspirin 81 MG EC tablet Take by mouth.    Marland Kitchen atorvastatin (LIPITOR) 20 MG tablet Take 1 tablet (20 mg total) by mouth daily. 90 tablet 3  . ciprofloxacin (CIPRO) 500 MG tablet Take 1 tablet (500 mg total) by mouth 2 (two) times daily. 14  tablet 2  . co-enzyme Q-10 30 MG capsule Take by mouth.    . cyclobenzaprine (FLEXERIL) 10 MG tablet Take 1 tablet (10 mg total) by mouth at bedtime. 90 tablet 1  . diclofenac sodium (VOLTAREN) 1 % GEL Apply 4 g topically 4 (four) times daily. To affected joint. 100 g 11  . lisinopril-hydrochlorothiazide (PRINZIDE,ZESTORETIC) 20-12.5 MG tablet Take 2 tablets by mouth daily. 180 tablet 3  . Miconazole Nitrate 2 % SOLN 4 drops to ear twice daily for 2 weeks 29.57 mL 12  . omeprazole (PRILOSEC) 20 MG capsule Take 1 capsule (20 mg total) by mouth daily. 90 capsule 3  . TURMERIC PO Take by mouth.    . Vitamin D, Cholecalciferol, 1000 units CAPS Take 1,000 units of lipase by mouth.    . carvedilol (COREG) 6.25 MG tablet Take 1 tablet (6.25 mg total) by mouth 2 (two) times daily with a meal. 60 tablet 3   No current facility-administered medications for this visit.    Not on File  Health Maintenance Health Maintenance  Topic Date Due  . HIV Screening  11/20/1972  . TETANUS/TDAP  10/21/2024  . COLONOSCOPY  11/07/2024  . INFLUENZA VACCINE  Completed  . Hepatitis C Screening  Completed     Exam:  BP (!) 188/118   Pulse 66   Wt 201 lb (91.2 kg)   BMI 28.03 kg/m  Gen: Well NAD HEENT: EOMI,  MMM  Lungs: Normal work of breathing. CTABL Heart: RRR no MRG Abd: NABS, Soft. Nondistended, Nontender Exts: Brisk capillary refill, warm and well perfused.   Twelve-lead EKG shows normal sinus rhythm.  No ST segment depression.  No Q waves.  No inverted T waves.  Slight biphasic appearance of T wave in V3 and V5 triggering septal infarct age undetermined on EKG.  I think this is less likely.  No evidence of acute MI    No results found for this or any previous visit (from the past 53 hour(s)). No results found.    Assessment and Plan: 61 y.o. male with  Hypertension: Elevated but not hypertensive emergency is no signs of endorgan damage.  Plan to obtain labs are ordered at the last visit  today.  Additionally I will increase the lisinopril hydrochlorothiazide to 40/25 and will start medium dose Coreg.  Additionally will obtain an echocardiogram in the near future.  Recheck soon.  Continue home blood pressure log.  Return if worsening.   Orders Placed This Encounter  Procedures  . ECHOCARDIOGRAM COMPLETE    Standing Status:   Future    Standing Expiration Date:   06/10/2018    Order Specific Question:   Where should this test be performed    Answer:   [facility]    Order Specific Question:   Perflutren DEFINITY (image enhancing agent) should be administered unless hypersensitivity or allergy exist    Answer:   Administer Perflutren    Order Specific Question:   Expected Date:    Answer:   1 month   Meds ordered this encounter  Medications  . lisinopril-hydrochlorothiazide (PRINZIDE,ZESTORETIC) 20-12.5 MG tablet    Sig: Take 2 tablets by mouth daily.    Dispense:  180 tablet    Refill:  3  . carvedilol (COREG) 6.25 MG tablet    Sig: Take 1 tablet (6.25 mg total) by mouth 2 (two) times daily with a meal.    Dispense:  60 tablet    Refill:  3     Discussed warning signs or symptoms. Please see discharge instructions. Patient expresses understanding.

## 2017-03-12 ENCOUNTER — Other Ambulatory Visit: Payer: Self-pay | Admitting: Family Medicine

## 2017-03-12 MED ORDER — ATORVASTATIN CALCIUM 80 MG PO TABS: 80.0000 mg | ORAL_TABLET | Freq: Every day | ORAL | 3 refills | Status: AC

## 2017-03-17 ENCOUNTER — Ambulatory Visit (HOSPITAL_COMMUNITY): Payer: 59 | Attending: Family Medicine

## 2017-03-17 ENCOUNTER — Other Ambulatory Visit: Payer: Self-pay

## 2017-03-17 DIAGNOSIS — R0602 Shortness of breath: Secondary | ICD-10-CM | POA: Insufficient documentation

## 2017-03-17 DIAGNOSIS — I10 Essential (primary) hypertension: Secondary | ICD-10-CM | POA: Diagnosis present

## 2017-03-17 MED ORDER — PERFLUTREN LIPID MICROSPHERE
1.0000 mL | INTRAVENOUS | Status: AC | PRN
  Administered 2017-03-17: 2 mL via INTRAVENOUS

## 2017-03-19 NOTE — Addendum Note (Signed)
Addended by: Huel Cote on: 03/19/2017 09:38 AM   Modules accepted: Orders

## 2017-03-25 ENCOUNTER — Telehealth: Payer: Self-pay | Admitting: Family Medicine

## 2017-03-25 NOTE — Telephone Encounter (Signed)
spoke to patient gave him advise as noted below. Lillieann Pavlich,CMA

## 2017-03-25 NOTE — Telephone Encounter (Signed)
BP looks better for the most part.  Anxiety is the likely cause for poor sleep. We will address it at the next check.

## 2017-03-25 NOTE — Telephone Encounter (Signed)
Pt stated he wanted to know what the next step was to figuring out why his BP is still running high. He wanted me to let you know that for the past few weeks he has been waking up in the middle of the night and not being able to go back to sleep. He just lays there and thinks about all kinds of things and didn't know if this can be contributing to why his bp is still high even with taking the medication. He would like to find out why it is high because he doesn't want to be on all the medicine forever. Here is a recording of his bp. Thanks for all your help.  Date  BP   15-Dec 135/73     18-Dec 144/91     19-Dec 169/99     20-Dec 159/98     21-Dec 145/95     25-Dec 155/100     27-Dec 140/88

## 2017-06-10 ENCOUNTER — Ambulatory Visit (HOSPITAL_COMMUNITY): Payer: 59 | Admitting: Licensed Clinical Social Worker

## 2017-09-02 ENCOUNTER — Ambulatory Visit: Payer: 59 | Admitting: Family Medicine

## 2018-08-20 IMAGING — DX DG KNEE COMPLETE 4+V*L*
4 series · 4 of 4 positions shown · non-contrast
Comparison: Right knee same day

CLINICAL DATA: Left knee pain, no known injury

EXAM:
LEFT KNEE - COMPLETE 4+ VIEW

[tunnel]
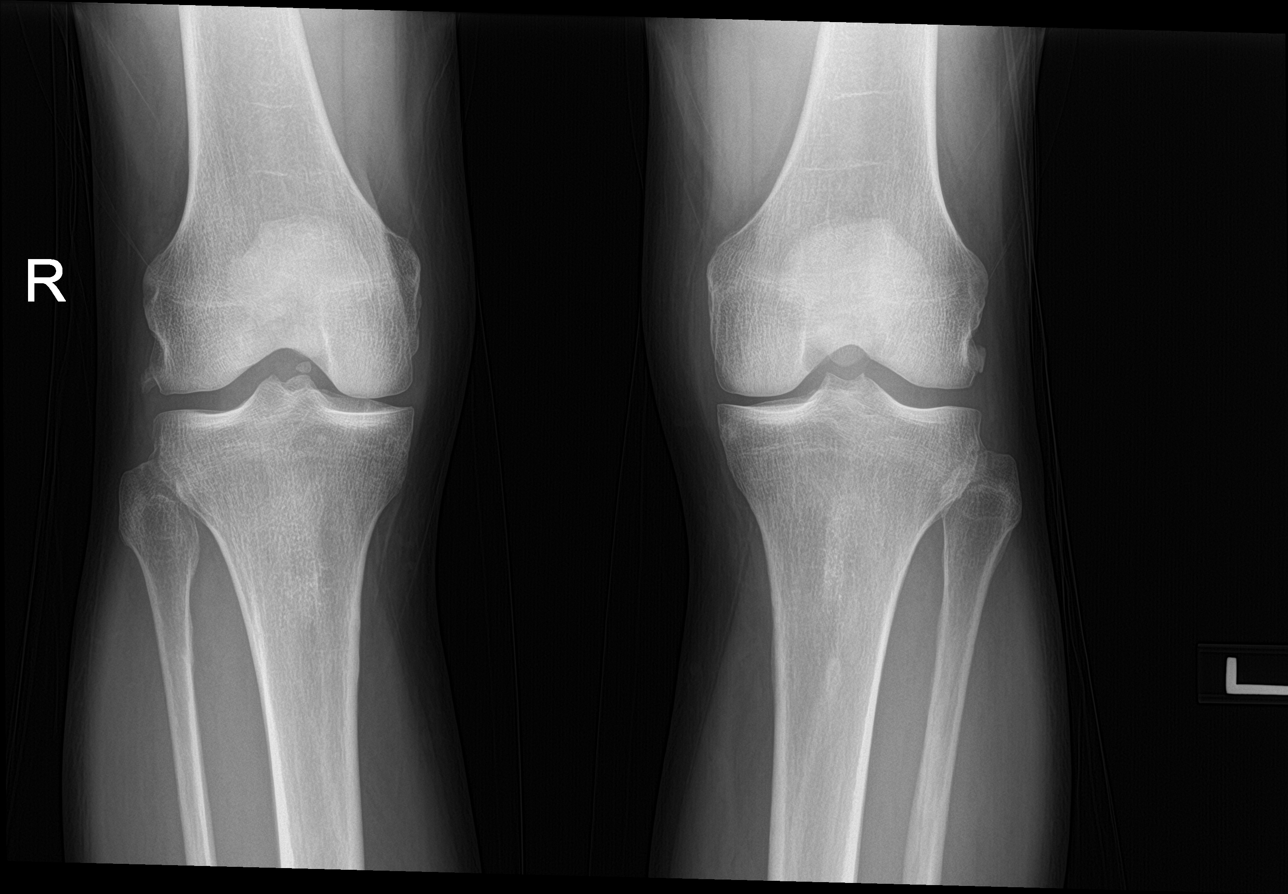

[knee lat]
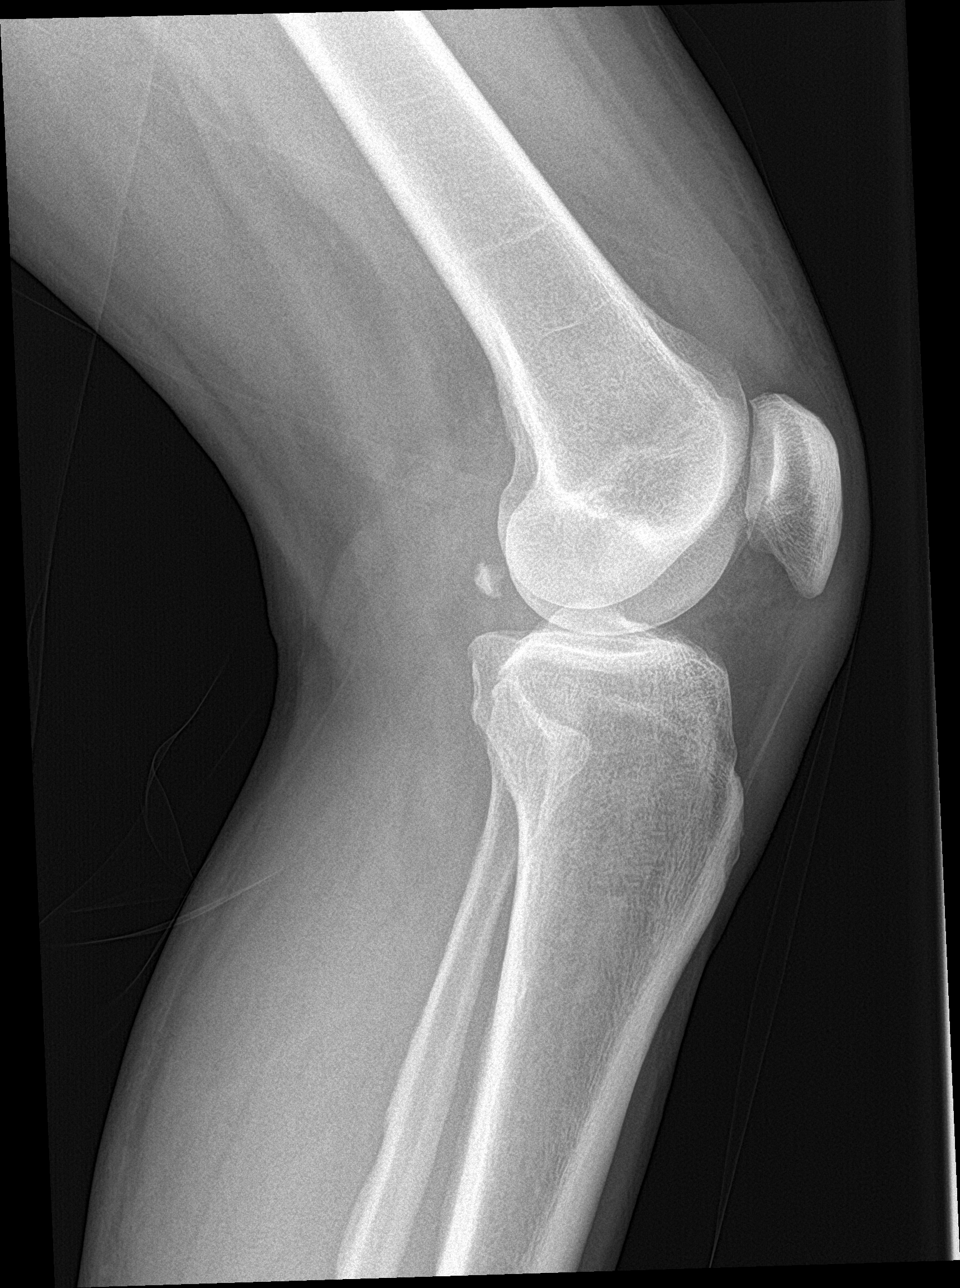

[knee sunrise]
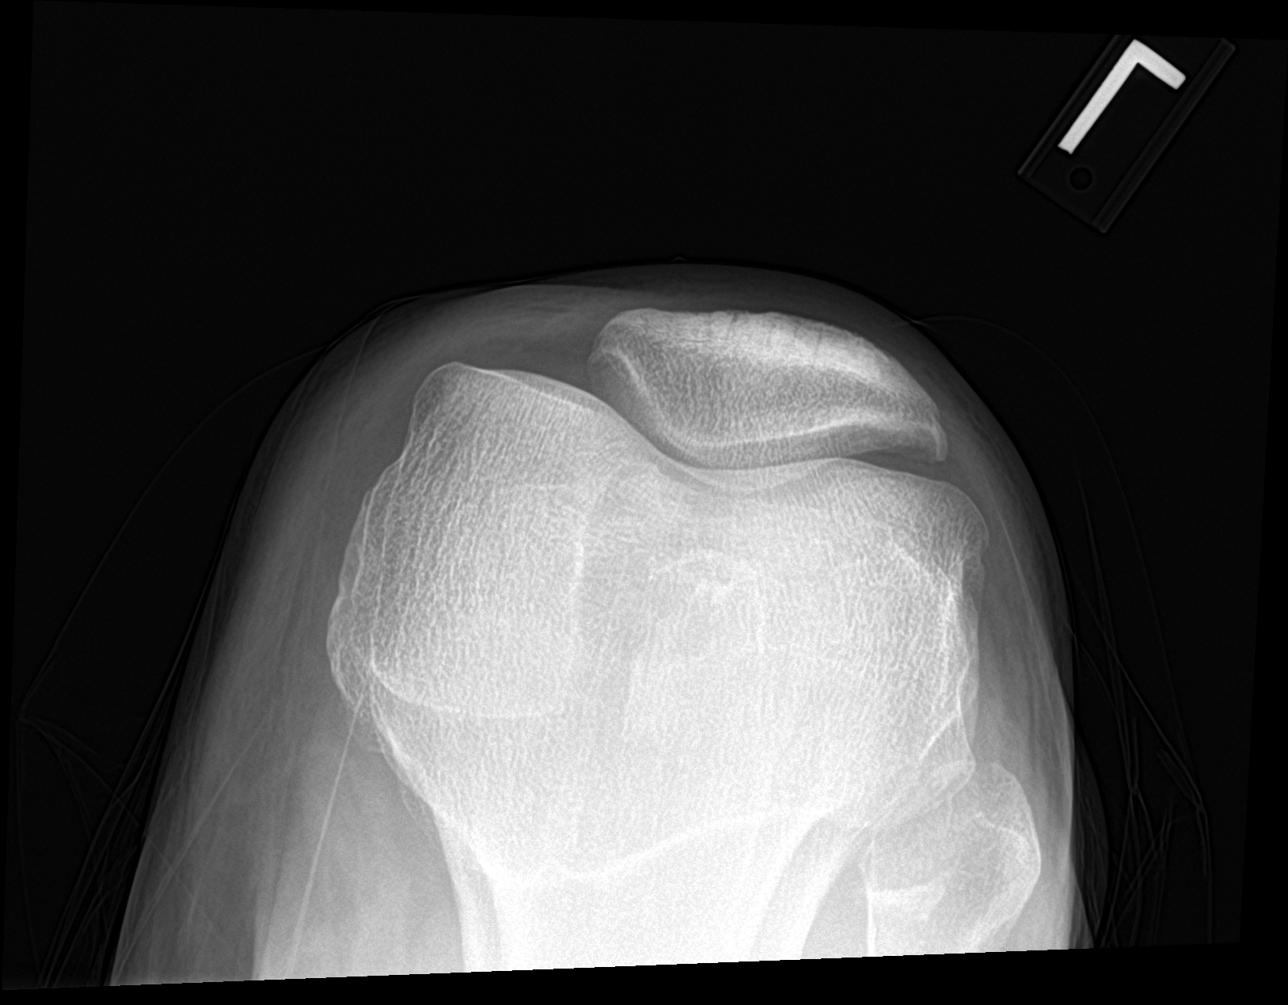

[knee ap bilat standing]
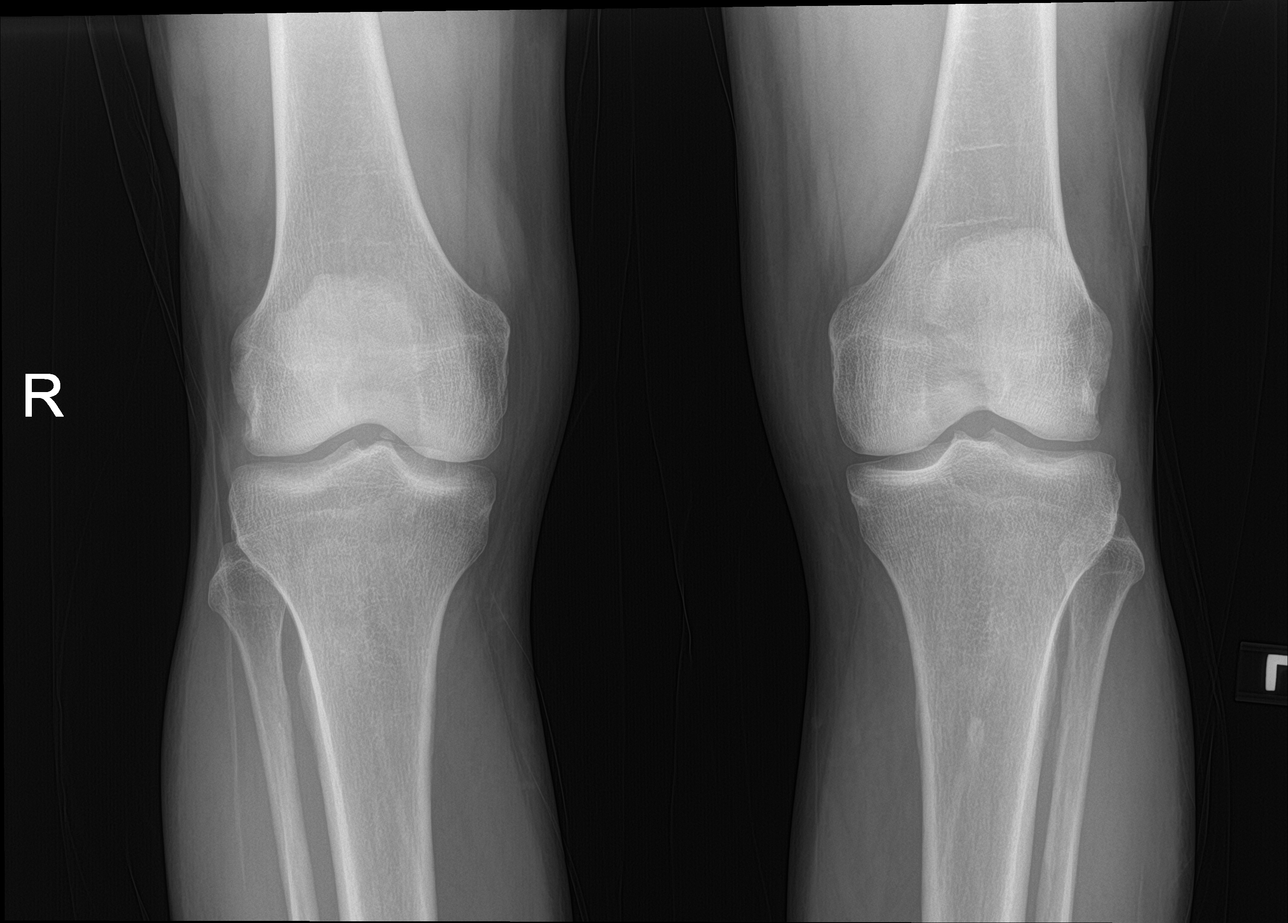

[4 of 4 positions shown; findings below may reference images not displayed]

FINDINGS: Four views of the left knee submitted. No acute fracture or
subluxation. There is mild narrowing of medial joint compartment.
Trace joint effusion.
IMPRESSION: No acute fracture or subluxation. Mild narrowing of medial joint
compartment. Trace joint effusion.

## 2018-11-03 ENCOUNTER — Other Ambulatory Visit: Payer: Self-pay | Admitting: Family Medicine
